# Patient Record
Sex: Female | Born: 1970 | Hispanic: Yes | Marital: Married | Smoking: Never smoker
Health system: Southern US, Community
[De-identification: ages and names within clinical notes are randomized; demographics above are authoritative.]

## PROBLEM LIST (undated history)

## (undated) DIAGNOSIS — O24419 Gestational diabetes mellitus in pregnancy, unspecified control: Secondary | ICD-10-CM

## (undated) HISTORY — PX: DIAGNOSTIC LAPAROSCOPY: SUR761

## (undated) HISTORY — PX: CHOLECYSTECTOMY: SHX55

## (undated) HISTORY — PX: BREAST SURGERY: SHX581

---

## 2004-11-21 ENCOUNTER — Ambulatory Visit: Payer: Self-pay | Admitting: Internal Medicine

## 2004-11-22 ENCOUNTER — Ambulatory Visit: Payer: Self-pay | Admitting: Internal Medicine

## 2004-11-23 ENCOUNTER — Ambulatory Visit: Payer: Self-pay | Admitting: Internal Medicine

## 2004-11-28 ENCOUNTER — Ambulatory Visit: Payer: Self-pay | Admitting: General Surgery

## 2006-08-08 ENCOUNTER — Ambulatory Visit: Payer: Self-pay

## 2006-09-05 ENCOUNTER — Ambulatory Visit: Payer: Self-pay | Admitting: Surgery

## 2006-09-17 ENCOUNTER — Inpatient Hospital Stay: Payer: Self-pay | Admitting: Surgery

## 2009-03-29 ENCOUNTER — Inpatient Hospital Stay: Payer: Self-pay | Admitting: Obstetrics and Gynecology

## 2011-10-31 ENCOUNTER — Observation Stay: Payer: Self-pay | Admitting: Internal Medicine

## 2013-08-19 ENCOUNTER — Other Ambulatory Visit: Payer: Self-pay | Admitting: Neurosurgery

## 2013-08-31 ENCOUNTER — Encounter (HOSPITAL_COMMUNITY): Payer: Self-pay

## 2013-08-31 ENCOUNTER — Encounter (HOSPITAL_COMMUNITY)
Admission: RE | Admit: 2013-08-31 | Discharge: 2013-08-31 | Disposition: A | Discharge status: Home / Self Care | Payer: Worker's Compensation | Source: Ambulatory Visit | Attending: Neurosurgery | Admitting: Neurosurgery

## 2013-08-31 HISTORY — DX: Gestational diabetes mellitus in pregnancy, unspecified control: O24.419

## 2013-08-31 LAB — BASIC METABOLIC PANEL
BUN: 14 mg/dL (ref 6–23)
CO2: 23 mEq/L (ref 19–32)
Calcium: 9.8 mg/dL (ref 8.4–10.5)
Creatinine, Ser: 0.64 mg/dL (ref 0.50–1.10)
GFR calc Af Amer: >90 mL/min (ref >90)
GFR calc non Af Amer: >90 mL/min (ref >90)
Glucose, Bld: 85 mg/dL (ref 70–99)
Sodium: 138 mEq/L (ref 135–145)

## 2013-08-31 LAB — SURGICAL PCR SCREEN: Staphylococcus aureus: POSITIVE — AB

## 2013-08-31 LAB — CBC
MCH: 27.7 pg (ref 26.0–34.0)
MCHC: 33 g/dL (ref 30.0–36.0)
MCV: 83.8 fL (ref 78.0–100.0)
Platelets: 245 10*3/uL (ref 150–400)
RDW: 12.9 % (ref 11.5–15.5)
WBC: 8.6 10*3/uL (ref 4.0–10.5)

## 2013-08-31 NOTE — Progress Notes (Signed)
Spanish interpreter, Jasmin Rios, assisted with PAT interview. She will be here for surgery on Friday also.

## 2013-08-31 NOTE — Pre-Procedure Instructions (Signed)
Jasmin Rios  08/31/2013   Your procedure is scheduled on:  Friday, September 03, 2013 at 10:30 AM.   Report to [facility] Entrance "A" at 8:30 AM.   Call this number if you have problems the morning of surgery: [phone]   Remember:   Do not eat food or drink liquids after midnight Thursday, 09/02/13.   Take these medicines the morning of surgery with A SIP OF WATER: gabapentin (NEURONTIN), HYDROcodone-acetaminophen (NORCO/VICODIN) - if needed, orphenadrine (NORFLEX) - if needed.   Stop Lodine (etodolac) as of today.    Do not wear jewelry, make-up or nail polish.  Do not wear lotions, powders, or perfumes. You may wear deodorant.  Do not shave 48 hours prior to surgery. Men may shave face and neck.  Do not bring valuables to the hospital.  [facility] is not responsible                  for any belongings or valuables.               Contacts, dentures or bridgework may not be worn into surgery.  Leave suitcase in the car. After surgery it may be brought to your room.  For patients admitted to the hospital, discharge time is determined by your                treatment team.           Special Instructions: Shower using CHG 2 nights before surgery and the night before surgery.  If you shower the day of surgery use CHG.  Use special wash - you have one bottle of CHG for all showers.  You should use approximately 1/3 of the bottle for each shower.   Please read over the following fact sheets that you were given: Pain Booklet, Coughing and Deep Breathing, MRSA Information and Surgical Site Infection Prevention

## 2013-09-01 NOTE — Progress Notes (Signed)
Erie Noe at Dr Dola Argyle office notified of PCR postive for staph and pt's pharmacy preference.

## 2013-09-02 NOTE — Progress Notes (Signed)
Patient called and instructed to arrive at short stay at 0800 instead of 0830. Patient verbalized understanding.

## 2013-09-03 ENCOUNTER — Other Ambulatory Visit: Payer: Self-pay

## 2013-09-03 ENCOUNTER — Encounter (HOSPITAL_COMMUNITY): Payer: Self-pay | Admitting: Anesthesiology

## 2013-09-03 ENCOUNTER — Encounter (HOSPITAL_COMMUNITY)
Admission: RE | Disposition: A | Discharge status: Home / Self Care | Payer: Self-pay | Source: Ambulatory Visit | Attending: Neurosurgery

## 2013-09-03 ENCOUNTER — Ambulatory Visit (HOSPITAL_COMMUNITY): Payer: Worker's Compensation | Admitting: Anesthesiology

## 2013-09-03 ENCOUNTER — Encounter (HOSPITAL_COMMUNITY): Payer: Worker's Compensation | Admitting: Anesthesiology

## 2013-09-03 ENCOUNTER — Ambulatory Visit (HOSPITAL_COMMUNITY)
Admission: RE | Admit: 2013-09-03 | Discharge: 2013-09-04 | Disposition: A | Discharge status: Home / Self Care | Payer: Worker's Compensation | Source: Ambulatory Visit | Attending: Neurosurgery | Admitting: Neurosurgery

## 2013-09-03 ENCOUNTER — Ambulatory Visit (HOSPITAL_COMMUNITY): Payer: Worker's Compensation

## 2013-09-03 DIAGNOSIS — M5126 Other intervertebral disc displacement, lumbar region: Secondary | ICD-10-CM

## 2013-09-03 DIAGNOSIS — E119 Type 2 diabetes mellitus without complications: Secondary | ICD-10-CM | POA: Insufficient documentation

## 2013-09-03 DIAGNOSIS — Z01812 Encounter for preprocedural laboratory examination: Secondary | ICD-10-CM | POA: Insufficient documentation

## 2013-09-03 DIAGNOSIS — Z23 Encounter for immunization: Secondary | ICD-10-CM | POA: Insufficient documentation

## 2013-09-03 HISTORY — PX: LUMBAR LAMINECTOMY/DECOMPRESSION MICRODISCECTOMY: SHX5026

## 2013-09-03 SURGERY — LUMBAR LAMINECTOMY/DECOMPRESSION MICRODISCECTOMY 1 LEVEL
Anesthesia: General | Site: Back | Laterality: Left | Wound class: Clean

## 2013-09-03 MED ORDER — ORPHENADRINE CITRATE ER 100 MG PO TB12
100.0000 mg | ORAL_TABLET | Freq: Every day | ORAL | Status: DC | PRN
  Filled 2013-09-03: qty 1

## 2013-09-03 MED ORDER — 0.9 % SODIUM CHLORIDE (POUR BTL) OPTIME
TOPICAL | Status: DC | PRN
  Administered 2013-09-03: 1000 mL

## 2013-09-03 MED ORDER — BACITRACIN 50000 UNITS IM SOLR
INTRAMUSCULAR | Status: DC | PRN
  Administered 2013-09-03: 11:00:00

## 2013-09-03 MED ORDER — PROPOFOL 10 MG/ML IV BOLUS
INTRAVENOUS | Status: DC | PRN
  Administered 2013-09-03: 200 mg via INTRAVENOUS

## 2013-09-03 MED ORDER — SODIUM CHLORIDE 0.9 % IV SOLN: 250.0000 mL | INTRAVENOUS | Status: DC

## 2013-09-03 MED ORDER — FENTANYL CITRATE 0.05 MG/ML IJ SOLN
INTRAMUSCULAR | Status: DC | PRN
  Administered 2013-09-03: 50 ug via INTRAVENOUS
  Administered 2013-09-03: 100 ug via INTRAVENOUS
  Administered 2013-09-03: 50 ug via INTRAVENOUS

## 2013-09-03 MED ORDER — NEOSTIGMINE METHYLSULFATE 1 MG/ML IJ SOLN
INTRAMUSCULAR | Status: DC | PRN
  Administered 2013-09-03: 3 mg via INTRAVENOUS

## 2013-09-03 MED ORDER — OXYCODONE HCL 5 MG PO TABS
5.0000 mg | ORAL_TABLET | Freq: Once | ORAL | Status: AC | PRN
  Administered 2013-09-03: 5 mg via ORAL

## 2013-09-03 MED ORDER — ACETAMINOPHEN 325 MG PO TABS: 650.0000 mg | ORAL_TABLET | ORAL | Status: DC | PRN

## 2013-09-03 MED ORDER — ALUM & MAG HYDROXIDE-SIMETH 200-200-20 MG/5ML PO SUSP: 30.0000 mL | Freq: Four times a day (QID) | ORAL | Status: DC | PRN

## 2013-09-03 MED ORDER — SODIUM CHLORIDE 0.9 % IJ SOLN
3.0000 mL | Freq: Two times a day (BID) | INTRAMUSCULAR | Status: DC
  Administered 2013-09-04: 3 mL via INTRAVENOUS

## 2013-09-03 MED ORDER — BUPIVACAINE HCL (PF) 0.25 % IJ SOLN
INTRAMUSCULAR | Status: DC | PRN
  Administered 2013-09-03: 10 mL

## 2013-09-03 MED ORDER — DEXAMETHASONE SODIUM PHOSPHATE 4 MG/ML IJ SOLN
4.0000 mg | Freq: Four times a day (QID) | INTRAMUSCULAR | Status: DC
  Filled 2013-09-03 (×4): qty 1

## 2013-09-03 MED ORDER — INFLUENZA VAC SPLIT QUAD 0.5 ML IM SUSP
0.5000 mL | INTRAMUSCULAR | Status: AC
Start: 2013-09-04 — End: 2013-09-04
  Administered 2013-09-04: 0.5 mL via INTRAMUSCULAR
  Filled 2013-09-03: qty 0.5

## 2013-09-03 MED ORDER — HYDROMORPHONE HCL PF 1 MG/ML IJ SOLN
0.2500 mg | INTRAMUSCULAR | Status: DC | PRN
  Administered 2013-09-03 (×2): 0.5 mg via INTRAVENOUS

## 2013-09-03 MED ORDER — MUPIROCIN 2 % EX OINT
TOPICAL_OINTMENT | CUTANEOUS | Status: AC
  Administered 2013-09-03: 08:00:00
  Filled 2013-09-03: qty 22

## 2013-09-03 MED ORDER — CEFAZOLIN SODIUM 1-5 GM-% IV SOLN
INTRAVENOUS | Status: AC
  Administered 2013-09-03: 2 g via INTRAVENOUS
  Filled 2013-09-03: qty 100

## 2013-09-03 MED ORDER — HYDROMORPHONE HCL PF 1 MG/ML IJ SOLN
INTRAMUSCULAR | Status: AC
  Filled 2013-09-03: qty 1

## 2013-09-03 MED ORDER — LACTATED RINGERS IV SOLN
INTRAVENOUS | Status: DC | PRN
  Administered 2013-09-03 (×2): via INTRAVENOUS

## 2013-09-03 MED ORDER — ROCURONIUM BROMIDE 100 MG/10ML IV SOLN
INTRAVENOUS | Status: DC | PRN
  Administered 2013-09-03: 50 mg via INTRAVENOUS

## 2013-09-03 MED ORDER — MENTHOL 3 MG MT LOZG
1.0000 | LOZENGE | OROMUCOSAL | Status: DC | PRN
  Filled 2013-09-03: qty 9

## 2013-09-03 MED ORDER — LACTATED RINGERS IV SOLN
INTRAVENOUS | Status: DC
  Administered 2013-09-03: 08:00:00 via INTRAVENOUS

## 2013-09-03 MED ORDER — CEFAZOLIN SODIUM 1-5 GM-% IV SOLN
1.0000 g | Freq: Three times a day (TID) | INTRAVENOUS | Status: AC
  Administered 2013-09-03 – 2013-09-04 (×2): 1 g via INTRAVENOUS
  Filled 2013-09-03 (×2): qty 50

## 2013-09-03 MED ORDER — SODIUM CHLORIDE 0.9 % IJ SOLN: 3.0000 mL | INTRAMUSCULAR | Status: DC | PRN

## 2013-09-03 MED ORDER — THROMBIN 5000 UNITS EX SOLR
CUTANEOUS | Status: DC | PRN
  Administered 2013-09-03 (×2): 5000 [IU] via TOPICAL

## 2013-09-03 MED ORDER — OXYCODONE HCL 5 MG PO TABS
ORAL_TABLET | ORAL | Status: AC
  Filled 2013-09-03: qty 1

## 2013-09-03 MED ORDER — METOCLOPRAMIDE HCL 5 MG/ML IJ SOLN: 10.0000 mg | Freq: Once | INTRAMUSCULAR | Status: DC | PRN

## 2013-09-03 MED ORDER — MIDAZOLAM HCL 5 MG/5ML IJ SOLN
INTRAMUSCULAR | Status: DC | PRN
  Administered 2013-09-03: 2 mg via INTRAVENOUS

## 2013-09-03 MED ORDER — DEXAMETHASONE 4 MG PO TABS
4.0000 mg | ORAL_TABLET | Freq: Four times a day (QID) | ORAL | Status: DC
  Administered 2013-09-03 – 2013-09-04 (×5): 4 mg via ORAL
  Filled 2013-09-03 (×8): qty 1

## 2013-09-03 MED ORDER — PHENOL 1.4 % MT LIQD: 1.0000 | OROMUCOSAL | Status: DC | PRN

## 2013-09-03 MED ORDER — HEMOSTATIC AGENTS (NO CHARGE) OPTIME
TOPICAL | Status: DC | PRN
  Administered 2013-09-03: 1 via TOPICAL

## 2013-09-03 MED ORDER — HYDROCODONE-ACETAMINOPHEN 5-325 MG PO TABS
1.0000 | ORAL_TABLET | Freq: Four times a day (QID) | ORAL | Status: DC | PRN
  Administered 2013-09-03 – 2013-09-04 (×5): 1 via ORAL
  Filled 2013-09-03 (×5): qty 1

## 2013-09-03 MED ORDER — ONDANSETRON HCL 4 MG/2ML IJ SOLN
INTRAMUSCULAR | Status: DC | PRN
  Administered 2013-09-03: 4 mg via INTRAMUSCULAR

## 2013-09-03 MED ORDER — GLYCOPYRROLATE 0.2 MG/ML IJ SOLN
INTRAMUSCULAR | Status: DC | PRN
  Administered 2013-09-03: 0.6 mg via INTRAVENOUS

## 2013-09-03 MED ORDER — LIDOCAINE HCL (CARDIAC) 20 MG/ML IV SOLN
INTRAVENOUS | Status: DC | PRN
  Administered 2013-09-03: 50 mg via INTRAVENOUS

## 2013-09-03 MED ORDER — LIDOCAINE-EPINEPHRINE 1 %-1:100000 IJ SOLN
INTRAMUSCULAR | Status: DC | PRN
  Administered 2013-09-03: 10 mL

## 2013-09-03 MED ORDER — ETODOLAC 300 MG PO CAPS
500.0000 mg | ORAL_CAPSULE | Freq: Two times a day (BID) | ORAL | Status: DC
  Administered 2013-09-03 – 2013-09-04 (×2): 500 mg via ORAL
  Filled 2013-09-03 (×3): qty 1

## 2013-09-03 MED ORDER — DEXAMETHASONE SODIUM PHOSPHATE 10 MG/ML IJ SOLN
INTRAMUSCULAR | Status: DC | PRN
  Administered 2013-09-03: 8 mg via INTRAVENOUS

## 2013-09-03 MED ORDER — CYCLOBENZAPRINE HCL 10 MG PO TABS
ORAL_TABLET | ORAL | Status: AC
  Filled 2013-09-03: qty 1

## 2013-09-03 MED ORDER — ONDANSETRON HCL 4 MG/2ML IJ SOLN: 4.0000 mg | INTRAMUSCULAR | Status: DC | PRN

## 2013-09-03 MED ORDER — HYDROMORPHONE HCL PF 1 MG/ML IJ SOLN
0.5000 mg | INTRAMUSCULAR | Status: DC | PRN
  Administered 2013-09-03 (×2): 1 mg via INTRAVENOUS
  Filled 2013-09-03 (×2): qty 1

## 2013-09-03 MED ORDER — CYCLOBENZAPRINE HCL 10 MG PO TABS
10.0000 mg | ORAL_TABLET | Freq: Three times a day (TID) | ORAL | Status: DC | PRN
  Administered 2013-09-03 – 2013-09-04 (×4): 10 mg via ORAL
  Filled 2013-09-03 (×4): qty 1

## 2013-09-03 MED ORDER — ACETAMINOPHEN 650 MG RE SUPP: 650.0000 mg | RECTAL | Status: DC | PRN

## 2013-09-03 MED ORDER — OXYCODONE HCL 5 MG/5ML PO SOLN: 5.0000 mg | Freq: Once | ORAL | Status: AC | PRN

## 2013-09-03 MED ORDER — ETODOLAC 500 MG PO TABS: 500.0000 mg | ORAL_TABLET | Freq: Two times a day (BID) | ORAL | Status: DC

## 2013-09-03 MED ORDER — GABAPENTIN 300 MG PO CAPS
300.0000 mg | ORAL_CAPSULE | Freq: Three times a day (TID) | ORAL | Status: DC
  Administered 2013-09-03 – 2013-09-04 (×4): 300 mg via ORAL
  Filled 2013-09-03 (×5): qty 1

## 2013-09-03 SURGICAL SUPPLY — 55 items
BAG DECANTER FOR FLEXI CONT (MISCELLANEOUS) ×2 IMPLANT
BENZOIN TINCTURE PRP APPL 2/3 (GAUZE/BANDAGES/DRESSINGS) ×2 IMPLANT
BLADE SURG 11 STRL SS (BLADE) ×2 IMPLANT
BLADE SURG ROTATE 9660 (MISCELLANEOUS) ×2 IMPLANT
BRUSH SCRUB EZ PLAIN DRY (MISCELLANEOUS) ×2 IMPLANT
BUR MATCHSTICK NEURO 3.0 LAGG (BURR) ×2 IMPLANT
BUR PRECISION FLUTE 6.0 (BURR) ×2 IMPLANT
CANISTER SUCTION 2500CC (MISCELLANEOUS) ×2 IMPLANT
CLOTH BEACON ORANGE TIMEOUT ST (SAFETY) IMPLANT
CONT SPEC 4OZ CLIKSEAL STRL BL (MISCELLANEOUS) ×2 IMPLANT
DECANTER SPIKE VIAL GLASS SM (MISCELLANEOUS) ×2 IMPLANT
DERMABOND ADVANCED (GAUZE/BANDAGES/DRESSINGS) ×1
DERMABOND ADVANCED .7 DNX12 (GAUZE/BANDAGES/DRESSINGS) ×1 IMPLANT
DRAPE LAPAROTOMY 100X72X124 (DRAPES) ×2 IMPLANT
DRAPE MICROSCOPE ZEISS OPMI (DRAPES) ×2 IMPLANT
DRAPE POUCH INSTRU U-SHP 10X18 (DRAPES) ×2 IMPLANT
DRAPE PROXIMA HALF (DRAPES) ×2 IMPLANT
DRAPE SURG 17X23 STRL (DRAPES) ×2 IMPLANT
DRSG OPSITE 4X5.5 SM (GAUZE/BANDAGES/DRESSINGS) IMPLANT
DRSG OPSITE POSTOP 4X6 (GAUZE/BANDAGES/DRESSINGS) ×2 IMPLANT
DURAPREP 26ML APPLICATOR (WOUND CARE) ×2 IMPLANT
ELECT REM PT RETURN 9FT ADLT (ELECTROSURGICAL) ×2
ELECTRODE REM PT RTRN 9FT ADLT (ELECTROSURGICAL) ×1 IMPLANT
GAUZE SPONGE 4X4 16PLY XRAY LF (GAUZE/BANDAGES/DRESSINGS) IMPLANT
GLOVE BIO SURGEON STRL SZ8 (GLOVE) ×2 IMPLANT
GLOVE BIOGEL PI IND STRL 7.5 (GLOVE) ×1 IMPLANT
GLOVE BIOGEL PI INDICATOR 7.5 (GLOVE) ×1
GLOVE ECLIPSE 6.5 STRL STRAW (GLOVE) ×2 IMPLANT
GLOVE ECLIPSE 7.5 STRL STRAW (GLOVE) ×4 IMPLANT
GLOVE EXAM NITRILE LRG STRL (GLOVE) IMPLANT
GLOVE EXAM NITRILE MD LF STRL (GLOVE) IMPLANT
GLOVE EXAM NITRILE XL STR (GLOVE) IMPLANT
GLOVE EXAM NITRILE XS STR PU (GLOVE) IMPLANT
GLOVE INDICATOR 8.5 STRL (GLOVE) ×2 IMPLANT
GOWN BRE IMP SLV AUR LG STRL (GOWN DISPOSABLE) ×2 IMPLANT
GOWN BRE IMP SLV AUR XL STRL (GOWN DISPOSABLE) ×4 IMPLANT
GOWN STRL REIN 2XL LVL4 (GOWN DISPOSABLE) IMPLANT
KIT BASIN OR (CUSTOM PROCEDURE TRAY) ×2 IMPLANT
KIT ROOM TURNOVER OR (KITS) ×2 IMPLANT
NEEDLE HYPO 22GX1.5 SAFETY (NEEDLE) ×2 IMPLANT
NEEDLE SPNL 22GX3.5 QUINCKE BK (NEEDLE) ×2 IMPLANT
NS IRRIG 1000ML POUR BTL (IV SOLUTION) ×2 IMPLANT
PACK LAMINECTOMY NEURO (CUSTOM PROCEDURE TRAY) ×2 IMPLANT
RUBBERBAND STERILE (MISCELLANEOUS) ×4 IMPLANT
SPONGE GAUZE 4X4 12PLY (GAUZE/BANDAGES/DRESSINGS) IMPLANT
SPONGE SURGIFOAM ABS GEL SZ50 (HEMOSTASIS) ×2 IMPLANT
STRIP CLOSURE SKIN 1/2X4 (GAUZE/BANDAGES/DRESSINGS) ×2 IMPLANT
SUT VIC AB 0 CT1 18XCR BRD8 (SUTURE) ×1 IMPLANT
SUT VIC AB 0 CT1 8-18 (SUTURE) ×1
SUT VIC AB 2-0 CT1 18 (SUTURE) ×2 IMPLANT
SUT VICRYL 4-0 PS2 18IN ABS (SUTURE) ×2 IMPLANT
SYR 20ML ECCENTRIC (SYRINGE) ×2 IMPLANT
TOWEL OR 17X24 6PK STRL BLUE (TOWEL DISPOSABLE) ×2 IMPLANT
TOWEL OR 17X26 10 PK STRL BLUE (TOWEL DISPOSABLE) ×2 IMPLANT
WATER STERILE IRR 1000ML POUR (IV SOLUTION) ×2 IMPLANT

## 2013-09-03 NOTE — Anesthesia Procedure Notes (Signed)
Procedure Name: Intubation Date/Time: 09/03/2013 10:06 AM Performed by: Gwenyth Allegra Pre-anesthesia Checklist: Emergency Drugs available, Patient identified, Timeout performed, Suction available and Patient being monitored Patient Re-evaluated:Patient Re-evaluated prior to inductionOxygen Delivery Method: Circle system utilized Preoxygenation: Pre-oxygenation with 100% oxygen Intubation Type: IV induction Ventilation: Mask ventilation without difficulty and Oral airway inserted - appropriate to patient size Laryngoscope Size: Mac and 3 Grade View: Grade I Tube size: 7.0 mm Number of attempts: 1 Airway Equipment and Method: Stylet Placement Confirmation: ETT inserted through vocal cords under direct vision,  breath sounds checked- equal and bilateral and positive ETCO2 Secured at: 21 cm Tube secured with: Tape Dental Injury: Teeth and Oropharynx as per pre-operative assessment

## 2013-09-03 NOTE — Progress Notes (Signed)
   Patient arrived on unit via stretcher from PACU.  Patient accompanied by her husband and an interpreter.  Patient is spanish speaking.  Patient Alert and oriented and welcomed to [facility]-informed of policies and plan of care.  Will continue to monitor. Mariam Dollar

## 2013-09-03 NOTE — Anesthesia Preprocedure Evaluation (Signed)
Anesthesia Evaluation  Patient identified by MRN, date of birth, ID band Patient awake    Reviewed: Allergy & Precautions, H&P , NPO status , Patient's Chart, lab work & pertinent test results, reviewed documented beta blocker date and time   Airway Mallampati: II TM Distance: >3 FB Neck ROM: full    Dental   Pulmonary neg pulmonary ROS,  breath sounds clear to auscultation        Cardiovascular negative cardio ROS  Rhythm:regular     Neuro/Psych negative neurological ROS  negative psych ROS   GI/Hepatic negative GI ROS, Neg liver ROS,   Endo/Other  diabetes  Renal/GU negative Renal ROS  negative genitourinary   Musculoskeletal   Abdominal   Peds  Hematology negative hematology ROS (+)   Anesthesia Other Findings See surgeon's H&P   Reproductive/Obstetrics negative OB ROS                           Anesthesia Physical Anesthesia Plan  ASA: II  Anesthesia Plan: General   Post-op Pain Management:    Induction: Intravenous  Airway Management Planned: Oral ETT  Additional Equipment:   Intra-op Plan:   Post-operative Plan: Extubation in OR  Informed Consent: I have reviewed the patients History and Physical, chart, labs and discussed the procedure including the risks, benefits and alternatives for the proposed anesthesia with the patient or authorized representative who has indicated his/her understanding and acceptance.   Dental Advisory Given  Plan Discussed with: CRNA and Surgeon  Anesthesia Plan Comments:         Anesthesia Quick Evaluation

## 2013-09-03 NOTE — Transfer of Care (Signed)
Immediate Anesthesia Transfer of Care Note  Patient: Jasmin Rios  Procedure(s) Performed: Procedure(s): Left lumbar four-five laminectomy (Left)  Patient Location: PACU  Anesthesia Type:General  Level of Consciousness: awake and alert   Airway & Oxygen Therapy: Patient Spontanous Breathing and Patient connected to nasal cannula oxygen  Post-op Assessment: Report given to PACU RN and Post -op Vital signs reviewed and stable  Post vital signs: Reviewed and stable  Complications: No apparent anesthesia complications

## 2013-09-03 NOTE — Anesthesia Postprocedure Evaluation (Signed)
Anesthesia Post Note  Patient: Jasmin Rios  Procedure(s) Performed: Procedure(s) (LRB): Left lumbar four-five laminectomy (Left)  Anesthesia type: General  Patient location: PACU  Post pain: Pain level controlled  Post assessment: Patient's Cardiovascular Status Stable  Last Vitals:  Filed Vitals:   09/03/13 1145  BP:   Pulse: 72  Temp:   Resp: 10    Post vital signs: Reviewed and stable  Level of consciousness: alert  Complications: No apparent anesthesia complications

## 2013-09-03 NOTE — H&P (Signed)
Jasmin Rios is an 42 y.o. female.   Chief Complaint: Back and left leg pain HPI: Patient very pleasant 42 year old female who sustained a work-related injury while she was moving a large box of socks resulting in a pop in: Her back and pain it radiates down her left leg to stop her foot and big toe. This is been refractory to all forms of conservative treatment she's been unable to return to work imaging revealed a disc herniation causing severe foraminal stenosis and thecal sac compression at L4-5 on the left and as this correlated her clinical syndrome and a clinical syndrome is progressing and she failed all forms of conservative treatment I recommended lumbar laminectomy microdiscectomy L4-5 left I extensively reviewed the risks and benefits of the operation with the patient through an interpreter as well as perioperative course and expectations of outcome alternatives of surgery she understands and agrees to proceed forward.  Past Medical History  Diagnosis Date  . Gestational diabetes     Past Surgical History  Procedure Laterality Date  . Cholecystectomy    . Diagnostic laparoscopy    . Breast surgery      breast abscess    No family history on file. Social History:  reports that she has never smoked. She has never used smokeless tobacco. She reports that she drinks alcohol. She reports that she does not use illicit drugs.  Allergies: No Known Allergies  Medications Prior to Admission  Medication Sig Dispense Refill  . etodolac (LODINE) 500 MG tablet Take 500 mg by mouth 2 (two) times daily.      Marland Kitchen gabapentin (NEURONTIN) 300 MG capsule Take 300 mg by mouth 3 (three) times daily.      Marland Kitchen HYDROcodone-acetaminophen (NORCO/VICODIN) 5-325 MG per tablet Take 1 tablet by mouth every 6 (six) hours as needed for pain.      . orphenadrine (NORFLEX) 100 MG tablet Take 100 mg by mouth daily as needed for muscle spasms.        Results for orders placed during the hospital encounter of  09/03/13 (from the past 48 hour(s))  GLUCOSE, CAPILLARY     Status: Abnormal   Collection Time    09/03/13  8:05 AM      Result Value Range   Glucose-Capillary 106 (*) 70 - 99 mg/dL   No results found.  Review of Systems  Constitutional: Negative.   HENT: Negative.   Eyes: Negative.   Respiratory: Negative.   Cardiovascular: Negative.   Gastrointestinal: Negative.   Genitourinary: Negative.   Musculoskeletal: Positive for back pain and myalgias.  Skin: Negative.   Neurological: Positive for tingling and sensory change.  Endo/Heme/Allergies: Negative.   Psychiatric/Behavioral: Negative.     Blood pressure 135/78, pulse 67, temperature 98.6 F (37 C), temperature source Oral, resp. rate 20, last menstrual period 09/03/2013, SpO2 99.00%. Physical Exam  Constitutional: She is oriented to person, place, and time. She appears well-developed and well-nourished.  HENT:  Head: Normocephalic.  Eyes: Pupils are equal, round, and reactive to light.  Neck: Normal range of motion.  Cardiovascular: Normal rate.   Respiratory: Effort normal.  GI: Soft.  Musculoskeletal: Normal range of motion.  Neurological: She is alert and oriented to person, place, and time. She has normal strength. GCS eye subscore is 4. GCS verbal subscore is 5. GCS motor subscore is 6.  Reflex Scores:      Patellar reflexes are 0 on the right side and 0 on the left side.  Achilles reflexes are 0 on the right side and 0 on the left side. Strength is 5 out of 5 in her iliopsoas, quads, hip she's, gastrocs, into tibialis, and EHL.  Skin: Skin is warm and dry.     Assessment/Plan 42 year female presents for a left-sided L4-5 laminectomy microdiscectomy  Kristion Holifield P 09/03/2013, 9:43 AM

## 2013-09-03 NOTE — Preoperative (Signed)
Beta Blockers   Reason not to administer Beta Blockers:Not Applicable 

## 2013-09-03 NOTE — Progress Notes (Signed)
Interpreter Wyvonnia Dusky for Tobi Bastos RD

## 2013-09-03 NOTE — Op Note (Signed)
Preoperative diagnosis: Left L5 radiculopathy from large acute disc L4-5 left  Postoperative diagnosis: Same  Procedure: Left L4-5 laminectomy microdiscectomy with microdissection of the left L5 nerve root and micro-discectomy  Surgeon: Jillyn Hidden Giuseppe Duchemin  Assistant: Sandra Cockayne  Anesthesia: Gen.  EBL: Minimal  History of present illness: Patient is a very pleasant 42 year old female who sustained a work-related injury resulting in back and left leg pain workup revealed a large acute disc L4-5 displacing the left L5 nerve root as this correlate with her clinical syndrome she failed all forms of conservative treatment I recommended laminectomy microdiscectomy I extensively reviewed the risks and benefits of the operation with the patient as well as perioperative course expectations about alternatives of surgery she understood and agreed to proceed forward. Patient brought into the or was induced under general anesthesia positioned prone the Wilson frame her back was prepped and draped in routine sterile fashion. Preoperative x-ray localize the appropriate level so after infiltration of 10 cc lidocaine with epi a midline incision was made and Bovie light cautery was used to gas subcutaneous tissues and subperiosteal dissections care lamina of L4-5. Interoperative x-ray confirmed identification of the appropriate level so than a high-speed drill was used to drill down the interest of the lamina of L4 medial facet complex aggressive at the lamina of L5 laminotomy was begun with a 3 and 4 mm Kerrison punch. Ligament was identified and removed in piecemeal fashion. The L5 pedicles identified the L5 nerve was identified and the L5 neuroforamen was unroofed. At this point the operating microscope was draped and brought in the field limits illumination the L5 nerve root was dissected off of very large central disc herniation still partially contained within the ligament. The LIMA was incised with lumbar scalpel disc  space cleanout pituitary rongeurs Epstein curettes several arthritis removed the central compartment and again the discectomy a coronary dilator and then the proximal to easily passed out the foramen across the midline confirming no further fragments appreciate the was then copiously irrigated meticulous hemostasis was maintained Gelfoam was laid up the dura the muscle fascia proximal in layers with after Vicryl and the skin was) 4 subcuticular. Benzoin and Steri-Strips were applied patient recovered in stable condition. At the end of case on it counts sponge counts were correct.

## 2013-09-04 MED ORDER — OXYCODONE-ACETAMINOPHEN 5-325 MG PO TABS
1.0000 | ORAL_TABLET | ORAL | Status: DC | PRN
  Administered 2013-09-04: 2 via ORAL
  Filled 2013-09-04 (×2): qty 2

## 2013-09-04 MED ORDER — HYDROCODONE-ACETAMINOPHEN 5-325 MG PO TABS: 1.0000 | ORAL_TABLET | Freq: Four times a day (QID) | ORAL | Status: AC | PRN

## 2013-09-04 MED ORDER — OXYCODONE-ACETAMINOPHEN 10-325 MG PO TABS: 2.0000 | ORAL_TABLET | ORAL | Status: AC | PRN

## 2013-09-04 NOTE — Discharge Summary (Signed)
   Physician Discharge Summary  Patient ID: Jasmin Rios MRN: [id number] DOB/AGE: 1971-05-21 42 y.o.  Admit date: 09/03/2013 Discharge date: 09/04/2013  Admission Diagnoses: Lumbar disc herniation  Discharge Diagnoses: Same Active Problems:   * No active hospital problems. *   Discharged Condition: Stable  Hospital Course:  [name] is a 42 y.o. female admitted after elective Left L4-5 microdiscectomy. The procedure was completed without complication and she was taken to the general neuroscience floor postoperatively. On POD# 1, she was at her neurologic baseline with significantly improved left leg pain. She was ambulating without difficulty, tolerating diet, voiding without difficulty, and therefore deemed stable for d/c.  Treatments: Surgery - Left L4-5 discectomy  Discharge Exam: Blood pressure 100/61, pulse 85, temperature 98 F (36.7 C), temperature source Oral, resp. rate 18, height 4\' 9"  (1.448 m), weight 80.6 kg (177 lb 11.1 oz), last menstrual period 09/03/2013, SpO2 95.00%. Awake, alert, oriented Speech fluent, appropriate CN grossly intact 5/5 BUE/BLE Wound c/d/i  Follow-up: Follow-up in Dr. Lonie Peak office [facility] [phone]) in 2-3 weeks  Disposition: Home  Discharge Orders   Future Orders Complete By Expires   Ambulate with assistance  As directed        Medication List         etodolac 500 MG tablet  Commonly known as:  LODINE  Take 500 mg by mouth 2 (two) times daily.     gabapentin 300 MG capsule  Commonly known as:  NEURONTIN  Take 300 mg by mouth 3 (three) times daily.     HYDROcodone-acetaminophen 5-325 MG per tablet  Commonly known as:  NORCO/VICODIN  Take 1 tablet by mouth every 6 (six) hours as needed.     HYDROcodone-acetaminophen 5-325 MG per tablet  Commonly known as:  NORCO/VICODIN  Take 1 tablet by mouth every 6 (six) hours as needed for pain.     orphenadrine 100 MG tablet  Commonly known  as:  NORFLEX  Take 100 mg by mouth daily as needed for muscle spasms.         SignedLisbeth Renshaw, C 09/04/2013, 8:12 AM

## 2013-09-04 NOTE — Progress Notes (Signed)
Occupational Therapy Evaluation Patient Details Name: Nitara Szczerba MRN: [id number] DOB: 1971/04/01 Today's Date: 09/04/2013 Time: 1240-1311 OT Time Calculation (min): 31 min  OT Assessment / Plan / Recommendation History of present illness   42 y.o. female admitted after elective Left L4-5 microdiscectomy    Clinical Impression   Completed all education regarding ADL and compensatory techniques regarding bback precautions. Pt demonsttrated understanding. Will have 24/7 S after D/C.     OT Assessment  Patient does not need any further OT services    Follow Up Recommendations  No OT follow up    Barriers to Discharge      Equipment Recommendations  Other (comment) (recommended that pt use showerchair)    Recommendations for Other Services    Frequency       Precautions / Restrictions Precautions Precautions: Back Precaution Booklet Issued: Yes (comment)   Pertinent Vitals/Pain 4. back    ADL  Upper Body Bathing: Set up;Supervision/safety Where Assessed - Upper Body Bathing: Unsupported sitting Lower Body Bathing: Minimal assistance Where Assessed - Lower Body Bathing: Unsupported sit to stand Upper Body Dressing: Set up;Supervision/safety Where Assessed - Upper Body Dressing: Unsupported sitting Lower Body Dressing: Minimal assistance Where Assessed - Lower Body Dressing: Supported sit to stand Toilet Transfer: Supervision/safety Statistician Method: Other (comment) (ambulating) Acupuncturist: Comfort height toilet Toileting - Clothing Manipulation and Hygiene: Modified independent Where Assessed - Toileting Clothing Manipulation and Hygiene: Sit to stand from 3-in-1 or toilet Transfers/Ambulation Related to ADLs: S ADL Comments: Educated on back precautions for comfort. Pt verbalized understanding.  Sister can assist as needed. Taught pt have to use reacher for dressing  OT Diagnosis:    OT Problem List:   OT Treatment Interventions:     OT  Goals(Current goals can be found in the care plan section)    Visit Information  Last OT Received On: 09/04/13 Assistance Needed: +1       Prior Functioning     Home Living Family/patient expects to be discharged to:: Private residence Living Arrangements: Other relatives Available Help at Discharge: Family;Available 24 hours/day Type of Home: House Home Access: Stairs to enter [employer] of Steps: 2 Home Layout: One level Home Equipment: Walker - 2 wheels Prior Function Level of Independence: Independent;Independent with assistive device(s) (occasionally used a RW) Communication Communication: Prefers language other than English Dominant Hand: Right         Vision/Perception     Cognition  Cognition Arousal/Alertness: Awake/alert Behavior During Therapy: WFL for tasks assessed/performed Overall Cognitive Status: Within Functional Limits for tasks assessed    Extremity/Trunk Assessment Upper Extremity Assessment Upper Extremity Assessment: Overall WFL for tasks assessed Lower Extremity Assessment Lower Extremity Assessment: Overall WFL for tasks assessed Cervical / Trunk Assessment Cervical / Trunk Assessment: Normal     Mobility Bed Mobility Bed Mobility: Rolling Right;Right Sidelying to Sit Rolling Right: 5: Supervision Right Sidelying to Sit: 5: Supervision Details for Bed Mobility Assistance: given instructions for log rolling Transfers Transfers: Sit to Stand;Stand to Sit Sit to Stand: 5: Supervision Stand to Sit: 5: Supervision     Exercise     Balance     End of Session OT - End of Session Activity Tolerance: Patient tolerated treatment well Patient left: in bed;with call bell/phone within reach;Other (comment) (with PT) Nurse Communication: Other (comment) (ready for D/C)  GO Functional Assessment Tool Used: clinical judgemetn Functional Limitation: Self care Self Care Current Status (E9528): At least 1 percent but less than 20  percent  impaired, limited or restricted Self Care Goal Status [phone]): At least 1 percent but less than 20 percent impaired, limited or restricted Self Care Discharge Status [phone]): At least 1 percent but less than 20 percent impaired, limited or restricted   Guerry Covington,HILLARY 09/04/2013, 2:27 PM [facility], OTR/L  [phone] 09/04/2013

## 2013-09-04 NOTE — Evaluation (Signed)
Physical Therapy Evaluation Patient Details Name: Jasmin Rios MRN: [id number] DOB: [birth year] Today's Date: 09/04/2013 Time: 8295-6213 PT Time Calculation (min): 28 min  PT Assessment / Plan / Recommendation History of Present Illness  Jasmin Rios is a 42 y.o. female admitted after elective Left L4-5 microdiscectomy  Clinical Impression  Patient is s/p L4-5 microdiskectomy surgery resulting in the deficits listed below (see PT Problem List). Patient will benefit from skilled PT to increase their independence and safety with mobility (while adhering to their precautions) to allow discharge      PT Assessment  Patient needs continued PT services    Follow Up Recommendations  No PT follow up;Supervision - Intermittent    Equipment Recommendations  None recommended by PT    Frequency Min 5X/week    Precautions / Restrictions Precautions Precautions: Back Precaution Booklet Issued: Yes (comment)   Pertinent Vitals/Pain 7/10 back pain;  RN gave pain meds      Mobility  Bed Mobility Bed Mobility: Rolling Right;Right Sidelying to Sit Rolling Right: 5: Supervision Right Sidelying to Sit: 5: Supervision Details for Bed Mobility Assistance: Pt sitting EOB with OT finishing evaluation when entering.  Transfers Transfers: Sit to Stand;Stand to Sit Sit to Stand: 5: Supervision Stand to Sit: 5: Supervision Details for Transfer Assistance: VCs for hand placement Ambulation/Gait Ambulation/Gait Assistance: 5: Supervision Ambulation Distance (Feet): 100 Feet Assistive device: Rolling walker Ambulation/Gait Assistance Details: VCs for RW placement and cues to increase gait speed Gait Pattern: Step-through pattern;Decreased stride length;Shuffle Gait velocity: decrease Stairs: Yes Stairs Assistance: 4: Min Editor, commissioning Details (indicate cue type and reason): HHA to maintain balance with cues for proper steps sequence Stair Management Technique: No  rails Number of Stairs: 3    Exercises     PT Diagnosis: Difficulty walking;Generalized weakness;Acute pain  PT Problem List: Decreased mobility;Decreased knowledge of use of DME;Pain;Decreased knowledge of precautions PT Treatment Interventions: DME instruction;Gait training;Stair training;Functional mobility training;Therapeutic activities;Therapeutic exercise;Balance training;Patient/family education     PT Goals(Current goals can be found in the care plan section) Acute Rehab PT Goals Patient Stated Goal: To go home  PT Goal Formulation: With patient/family Time For Goal Achievement: 09/11/13 Potential to Achieve Goals: Good  Visit Information  Last PT Received On: 09/04/13 Assistance Needed: +1 History of Present Illness: Jasmin Rios is a 42 y.o. female admitted after elective Left L4-5 microdiscectomy       Prior Functioning  Home Living Family/patient expects to be discharged to:: Private residence Living Arrangements: Other relatives Available Help at Discharge: Family;Available 24 hours/day Type of Home: House Home Access: Stairs to enter [employer] of Steps: 2 Home Layout: One level Home Equipment: Walker - 2 wheels Prior Function Level of Independence: Independent;Independent with assistive device(s) (occasionally used a RW) Communication Communication: Prefers language other than English Dominant Hand: Right    Cognition  Cognition Arousal/Alertness: Awake/alert Behavior During Therapy: WFL for tasks assessed/performed Overall Cognitive Status: Within Functional Limits for tasks assessed    Extremity/Trunk Assessment Upper Extremity Assessment Upper Extremity Assessment: Overall WFL for tasks assessed Lower Extremity Assessment Lower Extremity Assessment: Overall WFL for tasks assessed Cervical / Trunk Assessment Cervical / Trunk Assessment: Normal   Balance    End of Session PT - End of Session Equipment Utilized During Treatment:  Gait belt Activity Tolerance: Patient tolerated treatment well Patient left: in chair;with call bell/phone within reach;with family/visitor present Nurse Communication: Mobility status  GP Functional Limitation: Mobility: Walking and moving around Mobility: Walking and Moving Around  Current Status [phone]): At least 1 percent but less than 20 percent impaired, limited or restricted Mobility: Walking and Moving Around Goal Status [phone]): At least 1 percent but less than 20 percent impaired, limited or restricted   Zeev Deakins 09/04/2013, 4:10 PM [place], PT DPT [phone]

## 2013-09-08 ENCOUNTER — Encounter (HOSPITAL_COMMUNITY): Payer: Self-pay | Admitting: Neurosurgery

## 2014-03-10 ENCOUNTER — Ambulatory Visit: Payer: Self-pay | Admitting: Pain Medicine

## 2014-04-04 ENCOUNTER — Ambulatory Visit: Payer: Self-pay | Admitting: Pain Medicine

## 2014-05-10 ENCOUNTER — Ambulatory Visit: Payer: Self-pay | Admitting: Pain Medicine

## 2014-06-01 ENCOUNTER — Ambulatory Visit: Payer: Self-pay | Admitting: Pain Medicine

## 2014-07-05 ENCOUNTER — Ambulatory Visit: Payer: Self-pay | Admitting: Pain Medicine

## 2014-07-13 ENCOUNTER — Ambulatory Visit: Payer: Self-pay | Admitting: Pain Medicine

## 2015-01-02 ENCOUNTER — Ambulatory Visit: Payer: Self-pay | Admitting: Pain Medicine

## 2015-02-06 ENCOUNTER — Ambulatory Visit: Payer: Self-pay | Admitting: Pain Medicine

## 2015-03-06 ENCOUNTER — Ambulatory Visit
Admit: 2015-03-06 | Disposition: A | Discharge status: Home / Self Care | Payer: Self-pay | Attending: Pain Medicine | Admitting: Pain Medicine

## 2015-03-29 ENCOUNTER — Encounter: Payer: Self-pay | Admitting: Pain Medicine

## 2015-03-29 ENCOUNTER — Ambulatory Visit: Payer: Worker's Compensation | Attending: Pain Medicine | Admitting: Pain Medicine

## 2015-03-29 ENCOUNTER — Encounter (INDEPENDENT_AMBULATORY_CARE_PROVIDER_SITE_OTHER): Payer: Self-pay

## 2015-03-29 VITALS — BP 117/74 | HR 77 | Temp 98.4°F | Resp 16 | Ht <= 58 in | Wt 160.0 lb

## 2015-03-29 DIAGNOSIS — M5137 Other intervertebral disc degeneration, lumbosacral region: Secondary | ICD-10-CM | POA: Insufficient documentation

## 2015-03-29 DIAGNOSIS — M545 Low back pain: Secondary | ICD-10-CM | POA: Insufficient documentation

## 2015-03-29 DIAGNOSIS — M79605 Pain in left leg: Secondary | ICD-10-CM | POA: Diagnosis present

## 2015-03-29 DIAGNOSIS — M5416 Radiculopathy, lumbar region: Secondary | ICD-10-CM

## 2015-03-29 MED ORDER — CEFUROXIME AXETIL 250 MG PO TABS: 250.0000 mg | ORAL_TABLET | Freq: Two times a day (BID) | ORAL | Status: AC

## 2015-03-29 MED ORDER — ETODOLAC 500 MG PO TABS: 500.0000 mg | ORAL_TABLET | Freq: Two times a day (BID) | ORAL | Status: DC

## 2015-03-29 MED ORDER — TRIAMCINOLONE ACETONIDE 40 MG/ML IJ SUSP
INTRAMUSCULAR | Status: AC
  Administered 2015-03-29: 14:00:00
  Filled 2015-03-29: qty 1

## 2015-03-29 MED ORDER — MIDAZOLAM HCL 5 MG/5ML IJ SOLN
INTRAMUSCULAR | Status: AC
Start: 2015-03-29 — End: 2015-03-29
  Administered 2015-03-29: 4 mg
  Filled 2015-03-29: qty 5

## 2015-03-29 MED ORDER — GABAPENTIN 300 MG PO CAPS: ORAL_CAPSULE | ORAL | Status: DC

## 2015-03-29 MED ORDER — FENTANYL CITRATE (PF) 100 MCG/2ML IJ SOLN
INTRAMUSCULAR | Status: AC
  Administered 2015-03-29: 100 ug
  Filled 2015-03-29: qty 2

## 2015-03-29 MED ORDER — CEFAZOLIN SODIUM 1 G IJ SOLR
INTRAMUSCULAR | Status: AC
  Administered 2015-03-29: 14:00:00
  Filled 2015-03-29: qty 10

## 2015-03-29 MED ORDER — BUPIVACAINE HCL (PF) 0.25 % IJ SOLN
INTRAMUSCULAR | Status: AC
  Administered 2015-03-29: 14:00:00
  Filled 2015-03-29: qty 30

## 2015-03-29 MED ORDER — CEFUROXIME AXETIL 250 MG PO TABS: 250.0000 mg | ORAL_TABLET | Freq: Two times a day (BID) | ORAL | Status: DC

## 2015-03-29 NOTE — Progress Notes (Signed)
   Subjective:    Patient ID: Jasmin Rios, female    DOB: [birth year], 44 y.o.   MRN: [id number]  HPI  LBP  AND LE  PAIN ESPECIALLY LEFT LE  PAIN   Review of Systems     Objective:   Physical Exam        Assessment & Plan:

## 2015-03-29 NOTE — Procedures (Signed)
PROCEDURE PERFORMED: Lumbosacral selective nerve root block   Lumbosacral selective nerve root block needle placement at the L2 vertebral body level. A 22-gauge needle was inserted at the inferior border of the transverse process of vertebral body with needle placement medial to the midline of the transverse process on AP view of the lumbosacral spine. Needle placement was then accomplished at the L3 vertebral body level, L4 vertebral body level and L5 vertebral body level, utilizing the same technique as was utilized for placing needle at the L2 vertebral body level following needle placement at L2 L3 L4 and L5 on the left needle placement was then verified on lateral view with the needles verified at L2 L3 L4 and L5 on the left with the needle tip documented to be in the posterior superior quadrant of the end of vertebral foramen of L2 L3 L4 and L5 following negative aspiration of each needle for heme and CSF each needle was injected with 3 mils of quarter percent bupivacaine with Kenalog with a total of 10 mg of Kenalog utilized for the procedure  . Patient  Plan #1 medications continue Lodine and Neurontin patient is to [facility] Surgical evaluation as discussed Neurological evaluation as discussed May consider radiofrequency procedures intraspinal implantation and other treatment pending response to treatment on today's visit and follow-up evaluation line patient advised to call pain management prior to scheduled return, should they be significant change in condition of patient have other concerns regarding condition prior to scheduled return appointment tolerated the procedure well

## 2015-03-29 NOTE — Patient Instructions (Signed)
Selective Nerve Root Block Patient Information  Description: Specific nerve roots exit the spinal canal and these nerves can be compressed and inflamed by a bulging disc and bone spurs.  By injecting steroids on the nerve root, we can potentially decrease the inflammation surrounding these nerves, which often leads to decreased pain.  Also, by injecting local anesthesia on the nerve root, this can provide [place] helpful information to give to your referring doctor if it decreases your pain.  Selective nerve root blocks can be done along the spine from the neck to the low back depending on the location of your pain.   After numbing the skin with local anesthesia, a small needle is passed to the nerve root and the position of the needle is verified using x-ray pictures.  After the needle is in correct position, we then deposit the medication.  You may experience a pressure sensation while this is being done.  The entire block usually lasts less than 15 minutes.  Conditions that may be treated with selective nerve root blocks:  Low back and leg pain  Spinal stenosis  Diagnostic block prior to potential surgery  Neck and arm pain  Post laminectomy syndrome  Preparation for the injection:  1. Do not eat any solid food or dairy products within 6 hours of your appointment. 2. You may drink clear liquids up to 2 hours before an appointment.  Clear liquids include water, black coffee, juice or soda.  No milk or cream please. 3. You may take your regular medications, including pain medications, with a sip of water before your appointment.  Diabetics should hold regular insulin (if taken separately) and take 1/2 normal NPH dose the morning of the procedure.  Carry some sugar containing items with you to your appointment. 4. A driver must accompany you and be prepared to drive you home after your procedure. 5. Bring all your current medications with you. 6. An IV may be inserted and sedation may be given at  the discretion of the physician. 7. A blood pressure cuff, EKG, and other monitors will often be applied during the procedure.  Some patients may need to have extra oxygen administered for a short period. 8. You will be asked to provide medical information, including allergies, prior to the procedure.  We must know immediately if you are taking blood  Thinners (like Coumadin) or if you are allergic to IV iodine contrast (dye).  Possible side-effects: All are usually temporary  Bleeding from needle site  Light headedness  Numbness and tingling  Decreased blood pressure  Weakness in arms/legs  Pressure sensation in back/neck  Pain at injection site (several days)  Possible complications: All are extremely rare  Infection  Nerve injury  Spinal headache (a headache wore with upright position)  Call if you experience:  Fever/chills associated with headache or increased back/neck pain  Headache worsened by an upright position  New onset weakness or numbness of an extremity below the injection site  Hives or difficulty breathing (go to the emergency room)  Inflammation or drainage at the injection site(s)  Severe back/neck pain greater than usual  New symptoms which are concerning to you  Please note:  Although the local anesthetic injected can often make your back or neck feel good for several hours after the injection the pain will likely return.  It takes 3-5 days for steroids to work on the nerve root. You may not notice any pain relief for at least one week.  If effective,  we will often do a series of 3 injections spaced 3-6 weeks apart to maximally decrease your pain.     Be careful moving about. Muscle spasms in the area of the injection may occur.  Use ice for the next 24 hours (15 minutes on, 15 minutes off).  After 24 hours, you may use heat for comfort if you wish.  Post procedure numbness or redness is expected, average 4-6 hours.  If numbness develops  after 4-6 hours and is felt to be progressing and worsening, immediately contact your physician.   If you have any questions, please call [phone] [facility]

## 2015-03-29 NOTE — Progress Notes (Signed)
Discharged via w/c at  1430. Tolerating po fluids well. Instructions (verbal and written) given to patient. Teachback x3.  Scripts for Lodine, Gabapentin, and Ceftin were sent to the pharmacy.

## 2015-03-30 ENCOUNTER — Telehealth: Payer: Self-pay | Admitting: *Deleted

## 2015-03-30 NOTE — Telephone Encounter (Signed)
Spoke with patient via interpretor(Jackie). States no problems after procedure. Instructed to call for any problems /concerns.

## 2015-04-06 ENCOUNTER — Ambulatory Visit: Payer: Self-pay | Admitting: Pain Medicine

## 2015-04-30 ENCOUNTER — Other Ambulatory Visit: Payer: Self-pay | Admitting: Pain Medicine

## 2015-04-30 DIAGNOSIS — M5416 Radiculopathy, lumbar region: Secondary | ICD-10-CM

## 2015-04-30 DIAGNOSIS — M5481 Occipital neuralgia: Secondary | ICD-10-CM | POA: Insufficient documentation

## 2015-04-30 DIAGNOSIS — M47816 Spondylosis without myelopathy or radiculopathy, lumbar region: Secondary | ICD-10-CM

## 2015-04-30 DIAGNOSIS — M5137 Other intervertebral disc degeneration, lumbosacral region: Secondary | ICD-10-CM

## 2015-05-02 ENCOUNTER — Ambulatory Visit: Payer: Worker's Compensation | Attending: Pain Medicine | Admitting: Pain Medicine

## 2015-05-02 ENCOUNTER — Encounter: Payer: Self-pay | Admitting: Pain Medicine

## 2015-05-02 VITALS — BP 136/75 | HR 69 | Temp 98.0°F | Resp 16 | Ht <= 58 in | Wt 160.0 lb

## 2015-05-02 DIAGNOSIS — M5136 Other intervertebral disc degeneration, lumbar region: Secondary | ICD-10-CM | POA: Insufficient documentation

## 2015-05-02 DIAGNOSIS — M47896 Other spondylosis, lumbar region: Secondary | ICD-10-CM | POA: Insufficient documentation

## 2015-05-02 DIAGNOSIS — M5126 Other intervertebral disc displacement, lumbar region: Secondary | ICD-10-CM | POA: Insufficient documentation

## 2015-05-02 DIAGNOSIS — M5416 Radiculopathy, lumbar region: Secondary | ICD-10-CM

## 2015-05-02 DIAGNOSIS — M47816 Spondylosis without myelopathy or radiculopathy, lumbar region: Secondary | ICD-10-CM

## 2015-05-02 DIAGNOSIS — M5481 Occipital neuralgia: Secondary | ICD-10-CM

## 2015-05-02 DIAGNOSIS — M5137 Other intervertebral disc degeneration, lumbosacral region: Secondary | ICD-10-CM

## 2015-05-02 DIAGNOSIS — M51379 Other intervertebral disc degeneration, lumbosacral region without mention of lumbar back pain or lower extremity pain: Secondary | ICD-10-CM

## 2015-05-02 MED ORDER — GABAPENTIN 300 MG PO CAPS: ORAL_CAPSULE | ORAL | Status: AC

## 2015-05-02 MED ORDER — ETODOLAC 500 MG PO TABS: ORAL_TABLET | ORAL | Status: AC

## 2015-05-02 NOTE — Patient Instructions (Addendum)
Continue present medications Neurontin and Lodine  Lumbosacral selective nerve root block Wednesday, 05/17/2015  F/U PCP for evaliation of  BP and general medical  condition.  F/U surgical evaluation.  F/U neurological evaluation.  May consider radiofrequency rhizolysis or intraspinal procedures pending response to present treatment and F/U evaluation.  Patient to call [facility] should patient have concerns prior to scheduled return appointment. GENERAL RISKS AND COMPLICATIONS  What are the risk, side effects and possible complications? Generally speaking, most procedures are safe.  However, with any procedure there are risks, side effects, and the possibility of complications.  The risks and complications are dependent upon the sites that are lesioned, or the type of nerve block to be performed.  The closer the procedure is to the spine, the more serious the risks are.  Great care is taken when placing the radio frequency needles, block needles or lesioning probes, but sometimes complications can occur. 1. Infection: Any time there is an injection through the skin, there is a risk of infection.  This is why sterile conditions are used for these blocks.  There are four possible types of infection. 1. Localized skin infection. 2. Central Nervous System Infection-This can be in the form of Meningitis, which can be deadly. 3. Epidural Infections-This can be in the form of an epidural abscess, which can cause pressure inside of the spine, causing compression of the spinal cord with subsequent paralysis. This would require an emergency surgery to decompress, and there are no guarantees that the patient would recover from the paralysis. 4. Discitis-This is an infection of the intervertebral discs.  It occurs in about 1% of discography procedures.  It is difficult to treat and it may lead to surgery.        2. Pain: the needles have to go through skin and soft tissues, will cause  soreness.       3. Damage to internal structures:  The nerves to be lesioned may be near blood vessels or    other nerves which can be potentially damaged.       4. Bleeding: Bleeding is more common if the patient is taking blood thinners such as  aspirin, Coumadin, Ticiid, Plavix, etc., or if he/she have some genetic predisposition  such as hemophilia. Bleeding into the spinal canal can cause compression of the spinal  cord with subsequent paralysis.  This would require an emergency surgery to  decompress and there are no guarantees that the patient would recover from the  paralysis.       5. Pneumothorax:  Puncturing of a lung is a possibility, every time a needle is introduced in  the area of the chest or upper back.  Pneumothorax refers to free air around the  collapsed lung(s), inside of the thoracic cavity (chest cavity).  Another two possible  complications related to a similar event would include: Hemothorax and Chylothorax.   These are variations of the Pneumothorax, where instead of air around the collapsed  lung(s), you may have blood or chyle, respectively.       6. Spinal headaches: They may occur with any procedures in the area of the spine.       7. Persistent CSF (Cerebro-Spinal Fluid) leakage: This is a rare problem, but may occur  with prolonged intrathecal or epidural catheters either due to the formation of a fistulous  track or a dural tear.       8. Nerve damage: By working so close to the spinal cord, there  is always a possibility of  nerve damage, which could be as serious as a permanent spinal cord injury with  paralysis.       9. Death:  Although rare, severe deadly allergic reactions known as "Anaphylactic  reaction" can occur to any of the medications used.      10. Worsening of the symptoms:  We can always make thing worse.  What are the chances of something like this happening? Chances of any of this occuring are extremely low.  By statistics, you have more of a chance of  getting killed in a motor vehicle accident: while driving to the hospital than any of the above occurring .  Nevertheless, you should be aware that they are possibilities.  In general, it is similar to taking a shower.  Everybody knows that you can slip, hit your head and get killed.  Does that mean that you should not shower again?  Nevertheless always keep in mind that statistics do not mean anything if you happen to be on the wrong side of them.  Even if a procedure has a 1 (one) in a 1,000,000 (million) chance of going wrong, it you happen to be that one..Also, keep in mind that by statistics, you have more of a chance of having something go wrong when taking medications.  Who should not have this procedure? If you are on a blood thinning medication (e.g. Coumadin, Plavix, see list of "Blood Thinners"), or if you have an active infection going on, you should not have the procedure.  If you are taking any blood thinners, please inform your physician.  How should I prepare for this procedure?  Do not eat or drink anything at least six hours prior to the procedure.  Bring a driver with you .  It cannot be a taxi.  Come accompanied by an adult that can drive you back, and that is strong enough to help you if your legs get weak or numb from the local anesthetic.  Take all of your medicines the morning of the procedure with just enough water to swallow them.  If you have diabetes, make sure that you are scheduled to have your procedure done first thing in the morning, whenever possible.  If you have diabetes, take only half of your insulin dose and notify our nurse that you have done so as soon as you arrive at the clinic.  If you are diabetic, but only take blood sugar pills (oral hypoglycemic), then do not take them on the morning of your procedure.  You may take them after you have had the procedure.  Do not take aspirin or any aspirin-containing medications, at least eleven (11) days prior to  the procedure.  They may prolong bleeding.  Wear loose fitting clothing that may be easy to take off and that you would not mind if it got stained with Betadine or blood.  Do not wear any jewelry or perfume  Remove any nail coloring.  It will interfere with some of our monitoring equipment.  NOTE: Remember that this is not meant to be interpreted as a complete list of all possible complications.  Unforeseen problems may occur.  BLOOD THINNERS The following drugs contain aspirin or other products, which can cause increased bleeding during surgery and should not be taken for 2 weeks prior to and 1 week after surgery.  If you should need take something for relief of minor pain, you may take acetaminophen which is found in Tylenol,m Datril, Anacin-3  and Panadol. It is not blood thinner. The products listed below are.  Do not take any of the products listed below in addition to any listed on your instruction sheet.  A.P.C or A.P.C with Codeine Codeine Phosphate Capsules #3 Ibuprofen Ridaura  ABC compound Congesprin Imuran rimadil  Advil Cope Indocin Robaxisal  Alka-Seltzer Effervescent Pain Reliever and Antacid Coricidin or Coricidin-D  Indomethacin Rufen  Alka-Seltzer plus Cold Medicine Cosprin Ketoprofen S-A-C Tablets  Anacin Analgesic Tablets or Capsules Coumadin Korlgesic Salflex  Anacin Extra Strength Analgesic tablets or capsules CP-2 Tablets Lanoril Salicylate  Anaprox Cuprimine Capsules Levenox Salocol  Anexsia-D Dalteparin Magan Salsalate  Anodynos Darvon compound Magnesium Salicylate Sine-off  Ansaid Dasin Capsules Magsal Sodium Salicylate  Anturane Depen Capsules Marnal Soma  APF Arthritis pain formula Dewitt's Pills Measurin Stanback  Argesic Dia-Gesic Meclofenamic Sulfinpyrazone  Arthritis Bayer Timed Release Aspirin Diclofenac Meclomen Sulindac  Arthritis pain formula Anacin Dicumarol Medipren Supac  Analgesic (Safety coated) Arthralgen Diffunasal Mefanamic Suprofen  Arthritis  Strength Bufferin Dihydrocodeine Mepro Compound Suprol  Arthropan liquid Dopirydamole Methcarbomol with Aspirin Synalgos  ASA tablets/Enseals Disalcid Micrainin Tagament  Ascriptin Doan's Midol Talwin  Ascriptin A/D Dolene Mobidin Tanderil  Ascriptin Extra Strength Dolobid Moblgesic Ticlid  Ascriptin with Codeine Doloprin or Doloprin with Codeine Momentum Tolectin  Asperbuf Duoprin Mono-gesic Trendar  Aspergum Duradyne Motrin or Motrin IB Triminicin  Aspirin plain, buffered or enteric coated Durasal Myochrisine Trigesic  Aspirin Suppositories Easprin Nalfon Trillsate  Aspirin with Codeine Ecotrin Regular or Extra Strength Naprosyn Uracel  Atromid-S Efficin Naproxen Ursinus  Auranofin Capsules Elmiron Neocylate Vanquish  Axotal Emagrin Norgesic Verin  Azathioprine Empirin or Empirin with Codeine Normiflo Vitamin E  Azolid Emprazil Nuprin Voltaren  Bayer Aspirin plain, buffered or children's or timed BC Tablets or powders Encaprin Orgaran Warfarin Sodium  Buff-a-Comp Enoxaparin Orudis Zorpin  Buff-a-Comp with Codeine Equegesic Os-Cal-Gesic   Buffaprin Excedrin plain, buffered or Extra Strength Oxalid   Bufferin Arthritis Strength Feldene Oxphenbutazone   Bufferin plain or Extra Strength Feldene Capsules Oxycodone with Aspirin   Bufferin with Codeine Fenoprofen Fenoprofen Pabalate or Pabalate-SF   Buffets II Flogesic Panagesic   Buffinol plain or Extra Strength Florinal or Florinal with Codeine Panwarfarin   Buf-Tabs Flurbiprofen Penicillamine   Butalbital Compound Four-way cold tablets Penicillin   Butazolidin Fragmin Pepto-Bismol   Carbenicillin Geminisyn Percodan   Carna Arthritis Reliever Geopen Persantine   Carprofen Gold's salt Persistin   Chloramphenicol Goody's Phenylbutazone   Chloromycetin Haltrain Piroxlcam   Clmetidine heparin Plaquenil   Cllnoril Hyco-pap Ponstel   Clofibrate Hydroxy chloroquine Propoxyphen         Before stopping any of these medications, be sure to  consult the physician who ordered them.  Some, such as Coumadin (Warfarin) are ordered to prevent or treat serious conditions such as "deep thrombosis", "pumonary embolisms", and other heart problems.  The amount of time that you may need off of the medication may also vary with the medication and the reason for which you were taking it.  If you are taking any of these medications, please make sure you notify your pain physician before you undergo any procedures.         Selective Nerve Root Block Patient Information  Description: Specific nerve roots exit the spinal canal and these nerves can be compressed and inflamed by a bulging disc and bone spurs.  By injecting steroids on the nerve root, we can potentially decrease the inflammation surrounding these nerves, which often leads to decreased pain.  Also, by  injecting local anesthesia on the nerve root, this can provide [place] helpful information to give to your referring doctor if it decreases your pain.  Selective nerve root blocks can be done along the spine from the neck to the low back depending on the location of your pain.   After numbing the skin with local anesthesia, a small needle is passed to the nerve root and the position of the needle is verified using x-ray pictures.  After the needle is in correct position, we then deposit the medication.  You may experience a pressure sensation while this is being done.  The entire block usually lasts less than 15 minutes.  Conditions that may be treated with selective nerve root blocks:  Low back and leg pain  Spinal stenosis  Diagnostic block prior to potential surgery  Neck and arm pain  Post laminectomy syndrome  Preparation for the injection:  1. Do not eat any solid food or dairy products within 6 hours of your appointment. 2. You may drink clear liquids up to 2 hours before an appointment.  Clear liquids include water, black coffee, juice or soda.  No milk or cream please. 3. You  may take your regular medications, including pain medications, with a sip of water before your appointment.  Diabetics should hold regular insulin (if taken separately) and take 1/2 normal NPH dose the morning of the procedure.  Carry some sugar containing items with you to your appointment. 4. A driver must accompany you and be prepared to drive you home after your procedure. 5. Bring all your current medications with you. 6. An IV may be inserted and sedation may be given at the discretion of the physician. 7. A blood pressure cuff, EKG, and other monitors will often be applied during the procedure.  Some patients may need to have extra oxygen administered for a short period. 8. You will be asked to provide medical information, including allergies, prior to the procedure.  We must know immediately if you are taking blood  Thinners (like Coumadin) or if you are allergic to IV iodine contrast (dye).  Possible side-effects: All are usually temporary  Bleeding from needle site  Light headedness  Numbness and tingling  Decreased blood pressure  Weakness in arms/legs  Pressure sensation in back/neck  Pain at injection site (several days)  Possible complications: All are extremely rare  Infection  Nerve injury  Spinal headache (a headache wore with upright position)  Call if you experience:  Fever/chills associated with headache or increased back/neck pain  Headache worsened by an upright position  New onset weakness or numbness of an extremity below the injection site  Hives or difficulty breathing (go to the emergency room)  Inflammation or drainage at the injection site(s)  Severe back/neck pain greater than usual  New symptoms which are concerning to you  Please note:  Although the local anesthetic injected can often make your back or neck feel good for several hours after the injection the pain will likely return.  It takes 3-5 days for steroids to work on the  nerve root. You may not notice any pain relief for at least one week.  If effective, we will often do a series of 3 injections spaced 3-6 weeks apart to maximally decrease your pain.    If you have any questions, please call [phone] [facility]

## 2015-05-02 NOTE — Progress Notes (Signed)
   Subjective:    Patient ID: Jasmin Rios, female    DOB: [birth year], 44 y.o.   MRN: [id number]  HPI  Patient is 44 year old female returns [facility] for further evaluation and treatment of pain involving the lower back lower extremity region. Patient was accompanied by interpreter on today's visit to facilitate the evaluation. Patient states she has significant improvement of pain following previous procedure consisting of lumbosacral selective nerve root block. We discussed patient's overall condition patient with lower back lower extremity pain on the left as well as on the right. He continues medications consisting of Neurontin and Lodine. Patient's pain increases with standing walking twisting turning maneuvers becomes more intense as the day progresses. Pain is also interferes with patient's ability to obtain restful sleep. Patient states he has had significant improvement with prior interventional treatment we will consider patient for lumbosacral disc nerve root block to be performed at time return appointment is discussed with patient today's visit. The patient was understanding and agreement with suggested treatment plan.  Review of Systems     Objective:   Physical Exam mild tenderness of the splenius capitis and occipitalis region noted. Mild tinnitus of the acromial clavicular glenohumeral joint region. There was tennis over the thoracic facet thoracic paraspinal musculature region a minimal degree with no crepitus of the thoracic region noted. Tinel and Phalen's maneuver without increased pain of any significant degree patient appeared to be with bilaterally equal grip strength. There was tennis palpation over the lumbar paraspinal muscles region lumbar facet region palpation which reproduces moderate moderate severe discomfort left greater than right. Extension and palpation of the lumbar facets reproduce moderate moderately severe discomfort. Straight leg raising was  tolerated to approximately 20 without increase of pain with dorsiflexion noted. There was negative clonus negative Homans. DTRs difficult of this patient had difficulty relaxing no definite sensory deficit of dermatomal disability detected. Mild tenderness of the greater trochanteric region iliotibial band region. There was negative clonus negative Homans. Abdomen nontender with no costovertebral maintenance noted.      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 Descending S1 nerve root is close but not does not appear to be compressed by protrusi0n   Lumbar radiculopathy  Lumbar facet syndrome  Occipital neuralgia   Plan  Continue present medications. Neurontin and Lodine  Lumbosacral selective nerve root block to be performed at time return appointment  F/U PCP for evaliation of  BP and general medical  condition.  F/U surgical evaluation.  F/U neurological evaluation.  May consider radiofrequency rhizolysis or intraspinal procedures pending response to present treatment and F/U evaluation.  Patient to call [facility] Management Center should patient have concerns prior to scheduled return appointment.   CC:  Sheffield SliderLoretta Prentice, RN BSN, CCM [phone]  fax [phone]                    Orson Slickenise Frost,  [place] CCM      fax [phone]

## 2015-05-02 NOTE — Progress Notes (Signed)
   Subjective:    Patient ID: Jasmin Rios, female    DOB: 06-15-1971, 44 y.o.   MRN: [id number]  HPI    Review of Systems     Objective:   Physical Exam        Assessment & Plan:

## 2015-05-02 NOTE — Progress Notes (Signed)
Discharged to home ambulatory. Scripts e scribed.  Interpreter and case worker in room.  Pre procedure instructions given.

## 2015-05-02 NOTE — Progress Notes (Signed)
Safety precautions to be maintained throughout the outpatient stay will include: orient to surroundings, keep bed in low position, maintain call bell within reach at all times, provide assistance with transfer out of bed and ambulation.  

## 2015-05-17 ENCOUNTER — Telehealth: Payer: Self-pay

## 2015-05-17 NOTE — Telephone Encounter (Signed)
Workers comp has settled. Pt has applied for medicaid will you continue to see pt?

## 2015-05-17 NOTE — Telephone Encounter (Signed)
Nurses and Shatoya,  Yes I will continue to see patient

## 2021-04-11 ENCOUNTER — Ambulatory Visit: Payer: Self-pay

## 2021-04-25 ENCOUNTER — Ambulatory Visit
Admission: RE | Admit: 2021-04-25 | Discharge: 2021-04-25 | Disposition: A | Discharge status: Home / Self Care | Payer: Self-pay | Source: Ambulatory Visit | Attending: Oncology | Admitting: Oncology

## 2021-04-25 ENCOUNTER — Other Ambulatory Visit: Payer: Self-pay

## 2021-04-25 ENCOUNTER — Encounter (INDEPENDENT_AMBULATORY_CARE_PROVIDER_SITE_OTHER): Payer: Self-pay

## 2021-04-25 ENCOUNTER — Encounter: Payer: Self-pay | Admitting: *Deleted

## 2021-04-25 ENCOUNTER — Ambulatory Visit: Payer: Self-pay | Attending: Oncology | Admitting: *Deleted

## 2021-04-25 VITALS — BP 133/86 | HR 65 | Temp 98.1°F | Ht <= 58 in | Wt 166.0 lb

## 2021-04-25 DIAGNOSIS — Z0000 Encounter for general adult medical examination without abnormal findings: Secondary | ICD-10-CM | POA: Insufficient documentation

## 2021-04-25 NOTE — Patient Instructions (Signed)
Gave patient hand-out, Women Staying Healthy, Active and Well from [facility], with education on breast health, pap smears, heart and colon health. 

## 2021-04-25 NOTE — Progress Notes (Signed)
  Subjective:     Patient ID: Jasmin Rios, female   DOB: 1971-11-15, 50 y.o.   MRN: [id number]  HPI   [facility] Medical History Record - 04/25/21 1023      Breast History   Screening cycle New    Provider ([facility]) [employer]    Initial Mammogram 04/25/21    Last Mammogram Annual    Last Mammogram Date 09/05/06    Provider (Mammogram)  Delford Field    Recent Breast Symptoms None      Breast Cancer History   Breast Cancer History No personal or family history      Previous History of Breast Problems   Breast Surgery or Biopsy Left   abscess; left breast [facility]   Breast Implants N/A    BSE Done Monthly      Gynecological/Obstetrical History   LMP --   2 years ago   Is there any chance that the client could be pregnant?  No    Age at menarche 23    Age at menopause 37    PAP smear history Annually    Provider (PAP) 2021 Dr. Ephraim Hamburger    Age at first live birth 35    Breast fed children Yes (type length in comments)   1 year   DES Exposure No    Cervical, Uterine or Ovarian cancer No    Family history of Cervial, Uterine or Ovarian cancer No    Hysterectomy No    Cervix removed No    Ovaries removed No    Laser/Cryosurgery No    Current method of birth control None    Current method of Estrogen/Hormone replacement None    Smoking history None    Comments No insurance            Review of Systems     Objective:   Physical Exam HENT:     Head:      Comments: Patient states she fell about 10 years ago, and this mass has been present since then Chest:          Assessment:     50 year old female presents to [facility] for annual screening.  Jasmin Rios, the interpreter present during the interview and exam.  Clinical breast exam unremarkable.  There are scars present at the left lateral breast from previous abscess.  Taught self breast awareness.  Last pap in August of 2021.  Those results are not available for review.  Will recommend next pap per [facility] guidelines.  Patient  has been screened for eligibility.  She does not have any insurance, Medicare or Medicaid.  She also meets financial eligibility.   Risk Assessment    Risk Scores      04/25/2021   Last edited by: Scarlett Presto, RN   5-year risk: 1 %   Lifetime risk: 9 %            Plan:     Screening mammogram ordered. Will follow up per [facility] protocol.

## 2021-05-03 ENCOUNTER — Encounter: Payer: Self-pay | Admitting: *Deleted

## 2021-05-03 NOTE — Progress Notes (Signed)
Letter mailed from the [facility] to inform patient of her normal mammogram results.  Patient is to follow-up with annual screening in one year. 

## 2023-04-11 IMAGING — MG MM DIGITAL SCREENING BILAT W/ TOMO AND CAD
6 of 10 series · 6 of 30 positions shown · non-contrast
Comparison: Previous exam(s).

CLINICAL DATA: Screening.

EXAM:
DIGITAL SCREENING BILATERAL MAMMOGRAM WITH TOMOSYNTHESIS AND CAD
TECHNIQUE: Bilateral screening digital craniocaudal and mediolateral oblique
mammograms were obtained. Bilateral screening digital breast
tomosynthesis was performed. The images were evaluated with
computer-aided detection.

[L CC synth-2D]
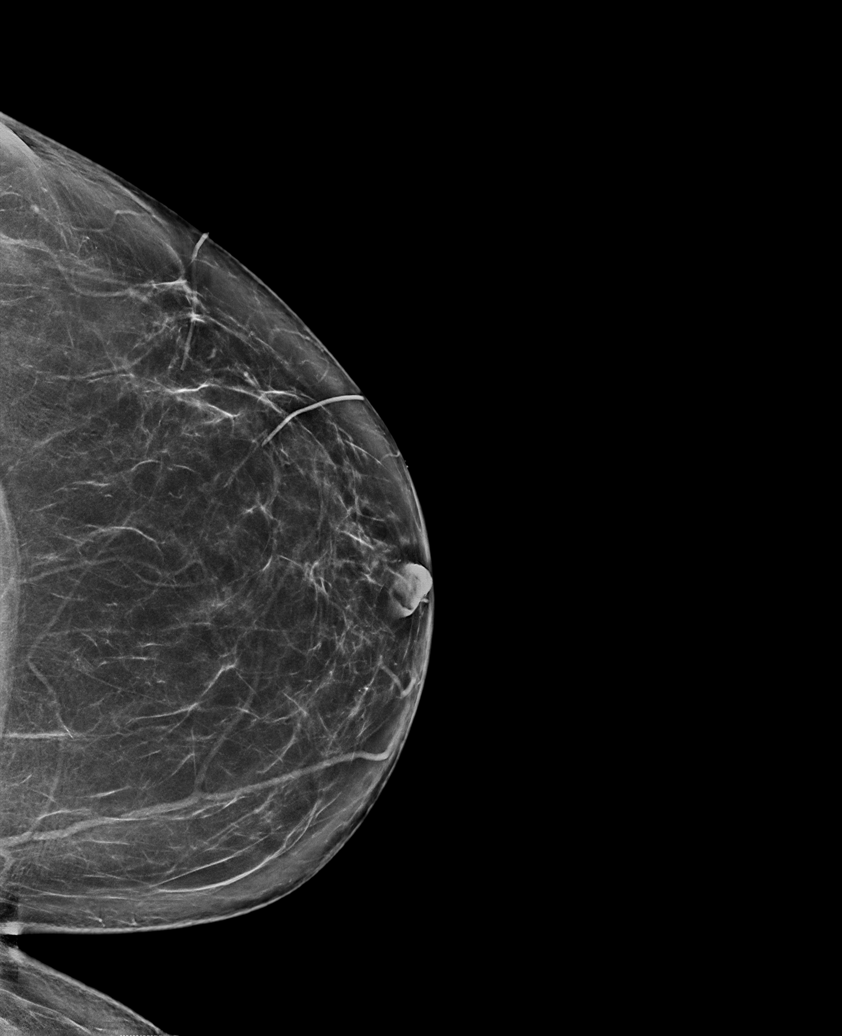

[R CC synth-2D]
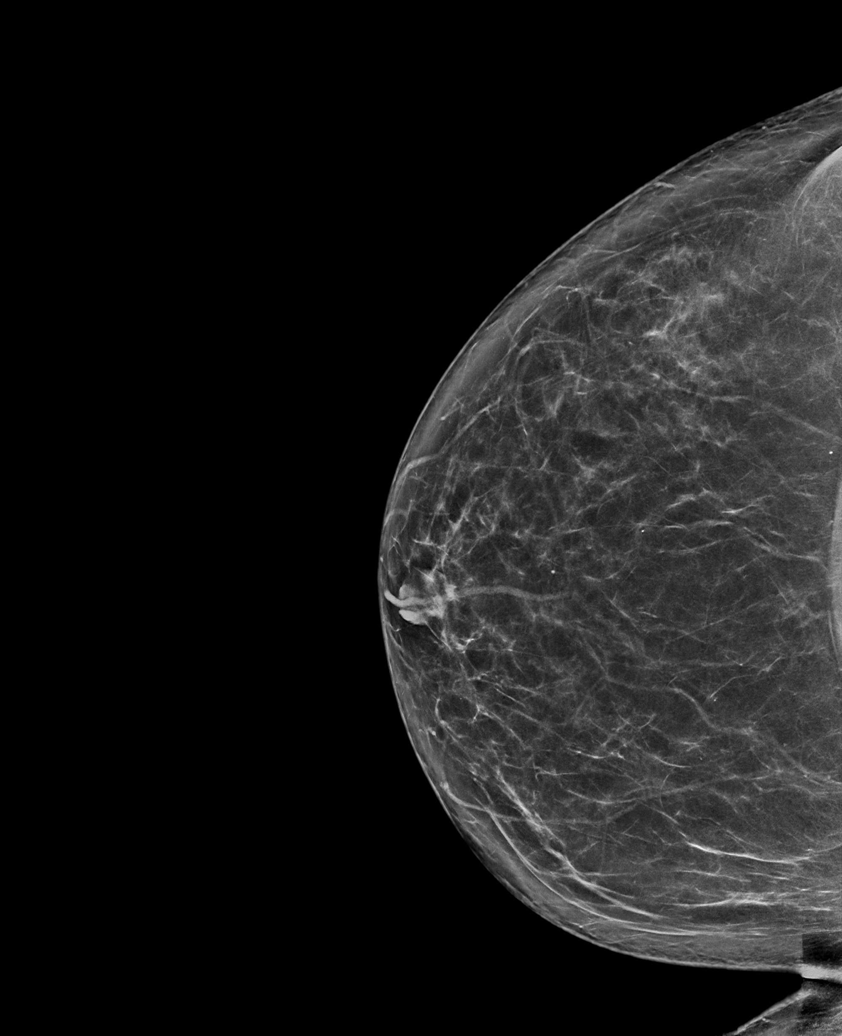

[L MLO synth-2D (1 of 2)]
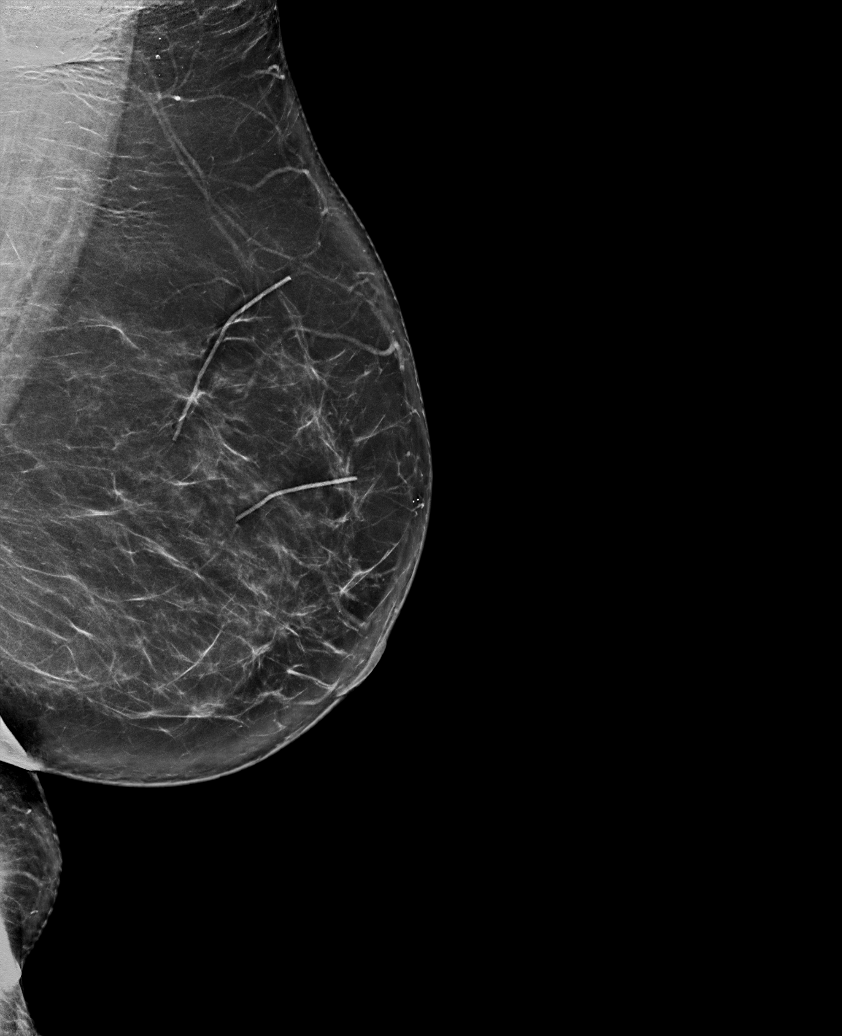

[L MLO synth-2D (2 of 2)]
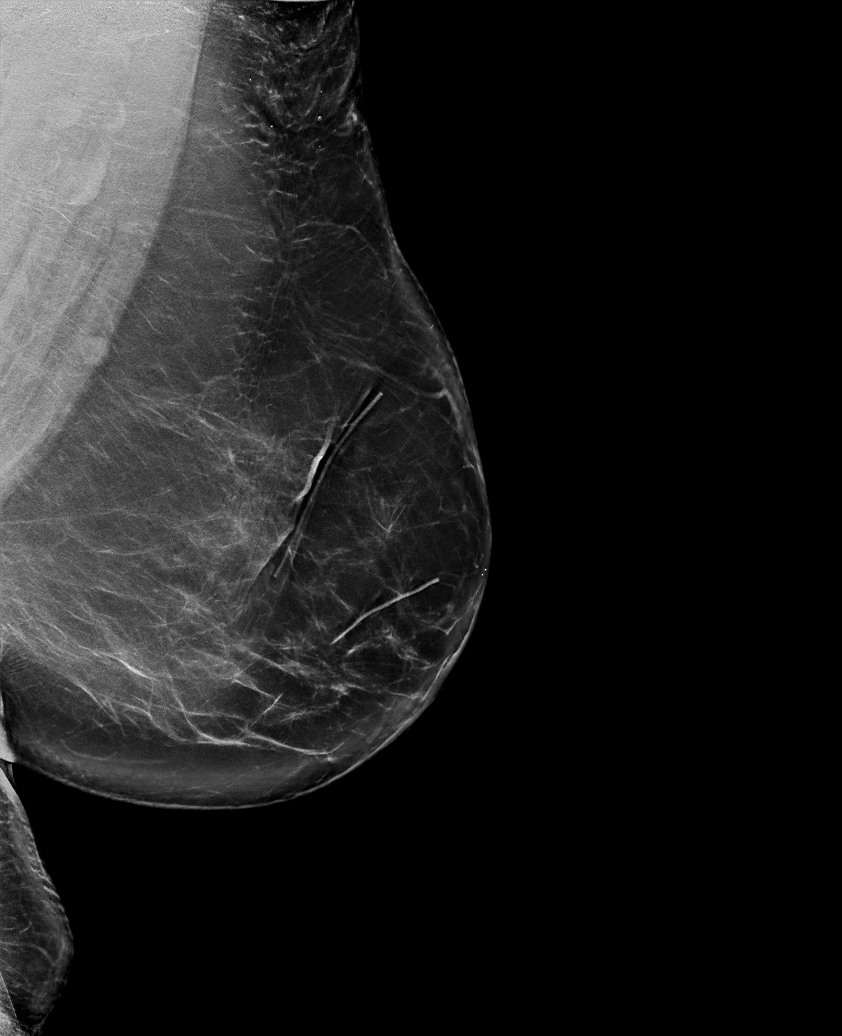

[R MLO synth-2D]
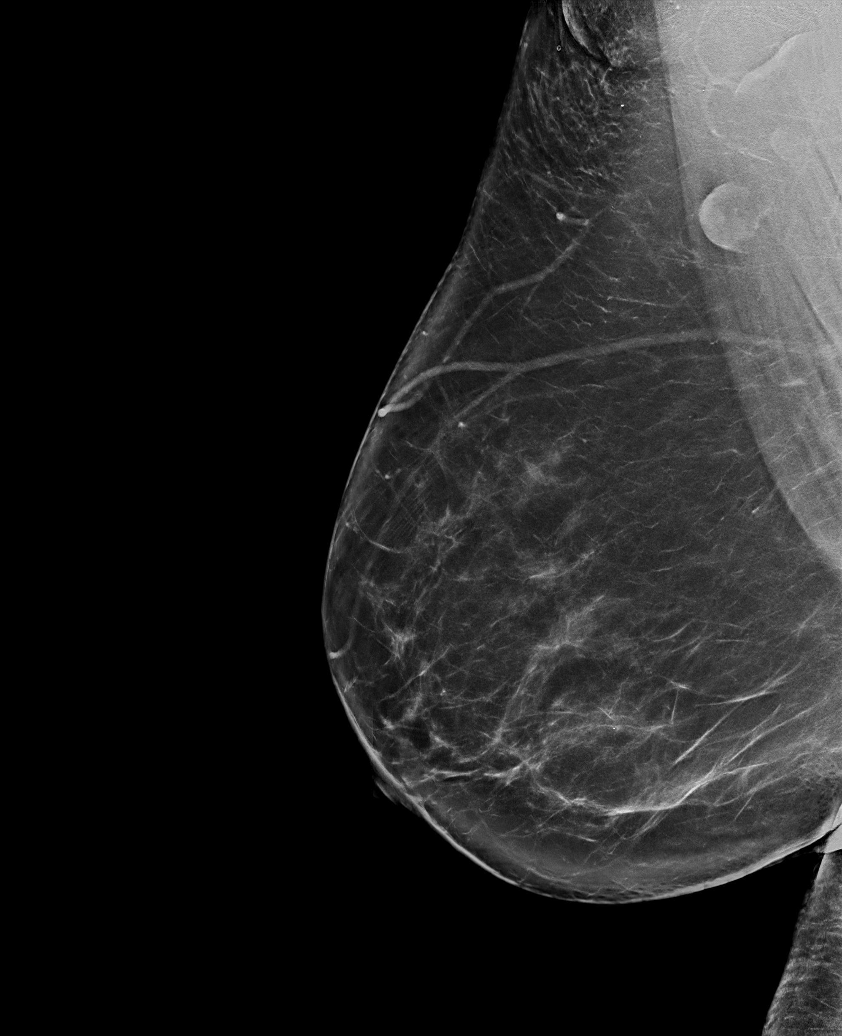

[L MLO tomo · tomo slice 44/87.0]
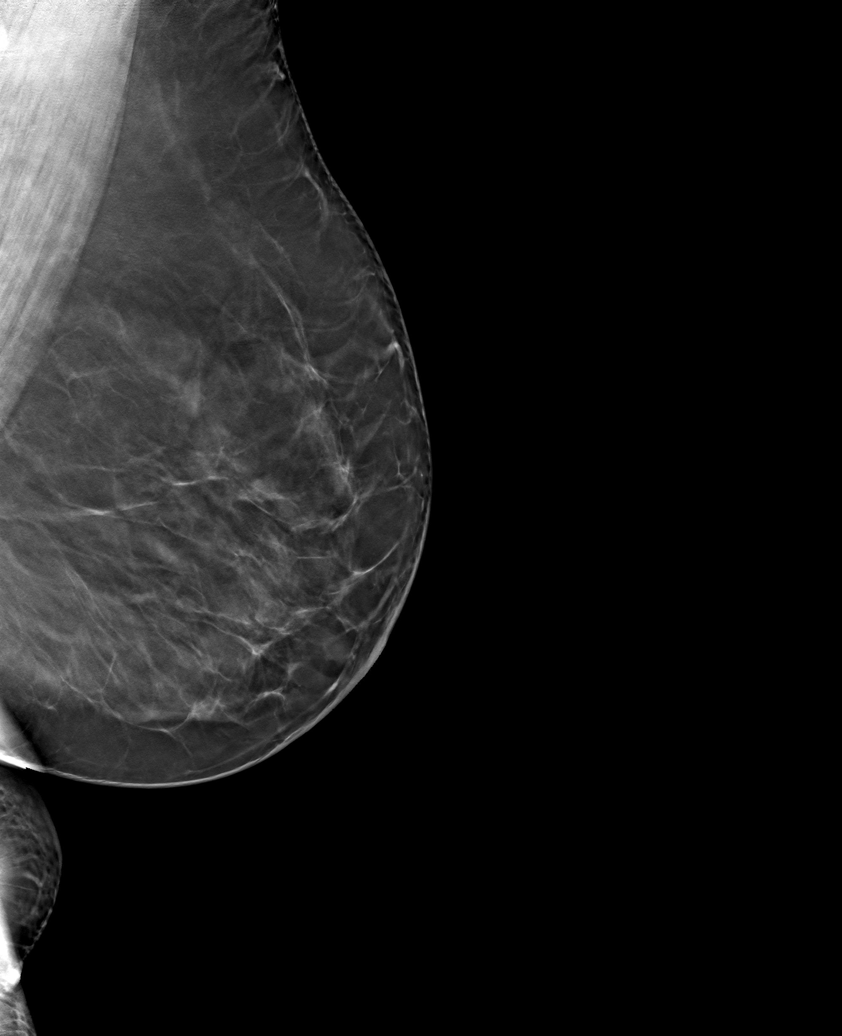

[6 of 30 positions shown; findings below may reference images not displayed]

ACR Breast Density Category b: There are scattered areas of
fibroglandular density.
FINDINGS: There are no findings suspicious for malignancy. The images were
evaluated with computer-aided detection.
IMPRESSION: No mammographic evidence of malignancy. A result letter of this
screening mammogram will be mailed directly to the patient.

RECOMMENDATION:
Screening mammogram in one year. (Code:WJ-[id number])

BI-RADS CATEGORY  1: Negative.

## 2024-09-09 ENCOUNTER — Other Ambulatory Visit: Payer: Self-pay | Admitting: Otolaryngology

## 2024-09-09 DIAGNOSIS — R430 Anosmia: Secondary | ICD-10-CM

## 2024-09-16 ENCOUNTER — Ambulatory Visit
Admission: RE | Admit: 2024-09-16 | Discharge: 2024-09-16 | Disposition: A | Discharge status: Home / Self Care | Payer: Self-pay | Source: Ambulatory Visit | Attending: Otolaryngology | Admitting: Otolaryngology

## 2024-09-16 DIAGNOSIS — R430 Anosmia: Secondary | ICD-10-CM

## 2024-09-16 MED ORDER — GADOPICLENOL 0.5 MMOL/ML IV SOLN
7.5000 mL | Freq: Once | INTRAVENOUS | Status: AC | PRN
  Administered 2024-09-16: 7.5 mL via INTRAVENOUS

## 2024-12-15 ENCOUNTER — Telehealth: Payer: Self-pay

## 2024-12-15 ENCOUNTER — Inpatient Hospital Stay
Admission: RE | Admit: 2024-12-15 | Discharge: 2024-12-15 | Disposition: A | Payer: Self-pay | Source: Ambulatory Visit | Attending: Family Medicine | Admitting: Family Medicine

## 2024-12-15 ENCOUNTER — Other Ambulatory Visit: Payer: Self-pay | Admitting: *Deleted

## 2024-12-15 DIAGNOSIS — Z1231 Encounter for screening mammogram for malignant neoplasm of breast: Secondary | ICD-10-CM
